# Patient Record
Sex: Male | Born: 2000
Health system: Southern US, Community
[De-identification: ages and names within clinical notes are randomized; demographics above are authoritative.]

## PROBLEM LIST (undated history)

## (undated) DIAGNOSIS — T7840XA Allergy, unspecified, initial encounter: Secondary | ICD-10-CM

## (undated) HISTORY — DX: Allergy, unspecified, initial encounter: T78.40XA

---
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
## 2022-04-16 ENCOUNTER — Encounter: Payer: Self-pay | Admitting: Family Medicine

## 2023-02-11 DIAGNOSIS — H5711 Ocular pain, right eye: Secondary | ICD-10-CM | POA: Diagnosis not present

## 2023-02-11 DIAGNOSIS — Z973 Presence of spectacles and contact lenses: Secondary | ICD-10-CM | POA: Diagnosis not present

## 2024-01-21 ENCOUNTER — Encounter: Payer: Self-pay | Admitting: Student

## 2024-01-21 ENCOUNTER — Ambulatory Visit: Admitting: Student

## 2024-01-21 VITALS — BP 128/72 | HR 81 | Ht 72.0 in | Wt 187.6 lb

## 2024-01-21 DIAGNOSIS — R7401 Elevation of levels of liver transaminase levels: Secondary | ICD-10-CM

## 2024-01-21 DIAGNOSIS — Z0000 Encounter for general adult medical examination without abnormal findings: Secondary | ICD-10-CM | POA: Diagnosis not present

## 2024-01-21 NOTE — Patient Instructions (Signed)
 Zylen,  I'm repeating some of the labs that they got at [facility] to ensure things have returned to normal. In the meantime, be sure that you follow-up with your psychiatry team. Please come back here in about three months for us  to repeat some other labs if your psychiatry team decides to keep you on Risperdal.  J Lark Plum, MD

## 2024-01-21 NOTE — Progress Notes (Signed)
    SUBJECTIVE:   Chief compliant/HPI: annual examination  George Payne is a 23 y.o. who presents today for an annual exam.   Reviewed and updated history . 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 UDS + for benzos and cannabis upon presentation. He was started on Risperdal monotherapy which he  reports taking regularly. Has psych follow-up coming up next week.     OBJECTIVE:   BP 128/72   Pulse 81   Ht 6' (1.829 m)   Wt 187 lb 9.6 oz (85.1 kg)   SpO2 99%   BMI 25.44 kg/m   Physical Exam Vitals reviewed.  Constitutional:      General: He is not in acute distress. Cardiovascular:     Rate and Rhythm: Normal rate and regular rhythm.     Heart sounds: No murmur heard.    No friction rub. No gallop.  Pulmonary:     Effort: Pulmonary effort is normal.  Neurological:     General: No focal deficit present.  Psychiatric:        Mood and Affect: Mood normal.        Behavior: Behavior normal.        Thought Content: Thought content normal.        Judgment: Judgment normal.      ASSESSMENT/PLAN:  Assessment & Plan  Annual Examination  See AVS for age appropriate recommendations  PHQ score 0, reviewed and discussed.  Blood pressure reviewed and at goal.      Considered the following items based upon USPSTF recommendations: HIV testing: declined Hepatitis C: declined Hepatitis B: declined Syphilis if at high risk: {declined GC/CTdeclined Lipid panel (nonfasting or fasting) discussed based upon AHA recommendations and not ordered.  Consider repeat every 4-6 years.  Reviewed risk factors for latent tuberculosis and not indicated  Note history of hypercalcemia and bumped AST (likely in setting of substance use) at the time of his ER presentation. Consider possibility this could contribute to psychiatric condition. Will confirm hyperCa, repeat LFT, and check a  PTH.   Follow up in 1 year or sooner if indicated.  MyChart Activation: Already signed up  J Lark Plum, MD [facility]

## 2024-01-23 ENCOUNTER — Ambulatory Visit: Payer: Self-pay | Admitting: Student

## 2024-01-23 LAB — COMPREHENSIVE METABOLIC PANEL WITH GFR
ALT: 14 IU/L (ref 0–44)
AST: 14 IU/L (ref 0–40)
Albumin: 4.8 g/dL (ref 4.3–5.2)
Alkaline Phosphatase: 76 IU/L (ref 51–125)
BUN/Creatinine Ratio: 9 (ref 9–20)
BUN: 9 mg/dL (ref 6–20)
Bilirubin Total: 0.5 mg/dL (ref 0.0–1.2)
CO2: 21 mmol/L (ref 20–29)
Calcium: 9.8 mg/dL (ref 8.7–10.2)
Chloride: 102 mmol/L (ref 96–106)
Creatinine, Ser: 1 mg/dL (ref 0.76–1.27)
Globulin, Total: 2.1 g/dL (ref 1.5–4.5)
Glucose: 92 mg/dL (ref 70–99)
Potassium: 4.3 mmol/L (ref 3.5–5.2)
Sodium: 139 mmol/L (ref 134–144)
Total Protein: 6.9 g/dL (ref 6.0–8.5)
eGFR: 110 mL/min/{1.73_m2} (ref >59)

## 2024-01-23 LAB — PTH, INTACT AND CALCIUM: PTH: 26 pg/mL (ref 15–65)

## 2024-01-24 ENCOUNTER — Other Ambulatory Visit: Payer: Self-pay

## 2024-01-24 ENCOUNTER — Emergency Department (HOSPITAL_COMMUNITY)
Admission: EM | Admit: 2024-01-24 | Discharge: 2024-01-24 | Disposition: A | Discharge status: Home / Self Care | Attending: Emergency Medicine | Admitting: Emergency Medicine

## 2024-01-24 ENCOUNTER — Encounter (HOSPITAL_COMMUNITY): Payer: Self-pay

## 2024-01-24 DIAGNOSIS — R112 Nausea with vomiting, unspecified: Secondary | ICD-10-CM | POA: Insufficient documentation

## 2024-01-24 DIAGNOSIS — R110 Nausea: Secondary | ICD-10-CM

## 2024-01-24 LAB — CBC WITH DIFFERENTIAL/PLATELET
Abs Immature Granulocytes: 0.01 10*3/uL (ref 0.00–0.07)
Basophils Absolute: 0 10*3/uL (ref 0.0–0.1)
Basophils Relative: 1 %
Eosinophils Absolute: 0 10*3/uL (ref 0.0–0.5)
Eosinophils Relative: 0 %
HCT: 46.1 % (ref 39.0–52.0)
Hemoglobin: 15.1 g/dL (ref 13.0–17.0)
Immature Granulocytes: 0 %
Lymphocytes Relative: 21 %
Lymphs Abs: 1.7 10*3/uL (ref 0.7–4.0)
MCH: 27.3 pg (ref 26.0–34.0)
MCHC: 32.8 g/dL (ref 30.0–36.0)
MCV: 83.4 fL (ref 80.0–100.0)
Monocytes Absolute: 0.5 10*3/uL (ref 0.1–1.0)
Monocytes Relative: 6 %
Neutro Abs: 5.9 10*3/uL (ref 1.7–7.7)
Neutrophils Relative %: 72 %
Platelets: 248 10*3/uL (ref 150–400)
RBC: 5.53 MIL/uL (ref 4.22–5.81)
RDW: 13 % (ref 11.5–15.5)
WBC: 8.2 10*3/uL (ref 4.0–10.5)
nRBC: 0 % (ref 0.0–0.2)

## 2024-01-24 LAB — COMPREHENSIVE METABOLIC PANEL WITH GFR
ALT: 17 U/L (ref 0–44)
AST: 17 U/L (ref 15–41)
Albumin: 4.7 g/dL (ref 3.5–5.0)
Alkaline Phosphatase: 64 U/L (ref 38–126)
Anion gap: 8 (ref 5–15)
BUN: 7 mg/dL (ref 6–20)
CO2: 26 mmol/L (ref 22–32)
Calcium: 9.6 mg/dL (ref 8.9–10.3)
Chloride: 101 mmol/L (ref 98–111)
Creatinine, Ser: 0.88 mg/dL (ref 0.61–1.24)
GFR, Estimated: >60 mL/min (ref >60)
Glucose, Bld: 99 mg/dL (ref 70–99)
Potassium: 3.6 mmol/L (ref 3.5–5.1)
Sodium: 135 mmol/L (ref 135–145)
Total Bilirubin: 1.1 mg/dL (ref 0.0–1.2)
Total Protein: 7.5 g/dL (ref 6.5–8.1)

## 2024-01-24 LAB — LIPASE, BLOOD: Lipase: 24 U/L (ref 11–51)

## 2024-01-24 MED ORDER — ONDANSETRON 4 MG PO TBDP
4.0000 mg | ORAL_TABLET | Freq: Once | ORAL | Status: AC
  Administered 2024-01-24: 4 mg via ORAL
  Filled 2024-01-24: qty 1

## 2024-01-24 MED ORDER — ONDANSETRON HCL 4 MG/2ML IJ SOLN
4.0000 mg | Freq: Once | INTRAMUSCULAR | Status: AC
  Administered 2024-01-24: 4 mg via INTRAVENOUS
  Filled 2024-01-24: qty 2

## 2024-01-24 MED ORDER — ONDANSETRON HCL 4 MG PO TABS: 4.0000 mg | ORAL_TABLET | Freq: Four times a day (QID) | ORAL | 0 refills | Status: AC

## 2024-01-24 NOTE — Discharge Instructions (Addendum)
 You were seen in the emergency room for nausea. The workup in the emergency room is overall reassuring.  Please start taking the nausea medication as needed.  We recommend that you follow-up with your primary care doctor for additional workup.  Hydrate well.

## 2024-01-24 NOTE — ED Notes (Signed)
 Notified provider of successful oral rehydration. Gave pt pedialyte dose and he states he was able to keep it down and had some nausea but no vomitting

## 2024-01-24 NOTE — ED Provider Notes (Signed)
 [place] EMERGENCY DEPARTMENT AT [facility] Provider Note   CSN: [id number] Arrival date & time: 01/24/24  4098     History  Chief Complaint  Patient presents with   Nausea    George Payne is a 23 y.o. male.  The history is provided by the patient.   George Payne is a 23 y.o. male who presents to the Emergency Department complaining of nausea.  He presents to the emergency department accompanied by his father for evaluation of symptoms that started several months ago with nausea and intermittent emesis.  He reports 40 pound weight loss.  He reports constipation with a BM every 2 to 3 days.  No abdominal pain, dysuria, fever, night sweats.  He has no known medical problems.  He has decreased oral intake secondary to the nausea.  No tobacco, alcohol use.  He uses occasional marijuana.  He started on Risperdal 3 weeks ago and is compliant with medications.  No prior abdominal surgeries.  No significant family medical history.  Home Medications Prior to Admission medications   Medication Sig Start Date End Date Taking? Authorizing Provider  risperiDONE (RISPERDAL) 1 MG tablet Take 1 mg by mouth at bedtime.    [provider]      Allergies    Codeine    Review of Systems   Review of Systems  All other systems reviewed and are negative.   Physical Exam Updated Vital Signs BP (!) 161/95   Pulse 78   Temp 98.2 F (36.8 C)   Resp 18   Ht 6' (1.829 m)   Wt 81.6 kg   SpO2 100%   BMI 24.41 kg/m  Physical Exam Vitals and nursing note reviewed.  Constitutional:      Appearance: He is well-developed.  HENT:     Head: Normocephalic and atraumatic.  Cardiovascular:     Rate and Rhythm: Normal rate and regular rhythm.  Pulmonary:     Effort: Pulmonary effort is normal. No respiratory distress.  Abdominal:     Palpations: Abdomen is soft.     Tenderness: There is no abdominal tenderness. There is no guarding or rebound.  Musculoskeletal:         General: No tenderness.  Skin:    General: Skin is warm and dry.  Neurological:     Mental Status: He is alert and oriented to person, place, and time.  Psychiatric:        Behavior: Behavior normal.     ED Results / Procedures / Treatments   Labs (all labs ordered are listed, but only abnormal results are displayed) Labs Reviewed - No data to display  EKG None  Radiology No results found.  Procedures Procedures    Medications Ordered in ED Medications - No data to display  ED Course/ Medical Decision Making/ A&P                                 Medical Decision Making Amount and/or Complexity of Data Reviewed Labs: ordered.  Risk Prescription drug management.   Patient here for evaluation of nausea, poor appetite.  He is nontoxic-appearing on evaluation with no significant abdominal tenderness.  Plan to check screening labs, treat his nausea.  Patient care transferred pending labs and reassessment.        Final Clinical Impression(s) / ED Diagnoses Final diagnoses:  None    Rx / DC Orders ED Discharge Orders  None         Kelsey Patricia, MD 01/24/24 [phone]

## 2024-01-24 NOTE — ED Notes (Signed)
 Patient is resting comfortably.

## 2024-01-24 NOTE — ED Provider Notes (Addendum)
  Physical Exam  BP (!) 161/95   Pulse 78   Temp 98.2 F (36.8 C)   Resp 18   Ht 6' (1.829 m)   Wt 81.6 kg   SpO2 100%   BMI 24.41 kg/m   Physical Exam  Procedures  Procedures  ED Course / MDM    Medical Decision Making Amount and/or Complexity of Data Reviewed Labs: ordered.  Risk Prescription drug management.   Assuming care of patient from Dr. Alayne Hubert   Patient in the ED for nausea. + weight loss. Workup pending.  Concerning findings are as following - none. Important pending results are - labs. CBC is back and normal.  According to Dr. Alayne Hubert, plan is to proceed with po challenge. Benign Physician exam including abd.   Patient had no complains, no concerns from the nursing side. Will continue to monitor.  9:12 AM Pt reassessed. Pt's VSS and WNL. . Pt has been hydrated in the ER and now passed po challenge.  Still feel nauseous, but no vomiting.  Indicates that the IV Zofran did help.  We will discharge with antiemetic. Strict ER return precautions have been discussed and pt will return if he is unable to tolerate fluids and symptoms are getting worse.    Deatra Face, MD 01/24/24 [phone]

## 2024-01-24 NOTE — ED Triage Notes (Signed)
 Ongoing vomiting after eating certain foods and drinking water.   Denies globus sensation.

## 2024-01-25 MED ORDER — ONDANSETRON 4 MG PO TBDP: 4.0000 mg | ORAL_TABLET | Freq: Three times a day (TID) | ORAL | 0 refills | Status: AC | PRN

## 2024-02-11 ENCOUNTER — Encounter: Payer: Self-pay | Admitting: *Deleted
